# Patient Record
Sex: Female | Born: 1955 | Race: White | Hispanic: No | Marital: Married
Health system: Southern US, Community
[De-identification: ages and names within clinical notes are randomized; demographics above are authoritative.]

---

## 2016-01-05 ENCOUNTER — Other Ambulatory Visit (HOSPITAL_COMMUNITY): Payer: Self-pay

## 2016-01-05 ENCOUNTER — Inpatient Hospital Stay
Admission: AD | Admit: 2016-01-05 | Discharge: 2016-01-15 | Disposition: E | Payer: Self-pay | Source: Other Acute Inpatient Hospital | Attending: Nephrology | Admitting: Nephrology

## 2016-01-05 DIAGNOSIS — J9690 Respiratory failure, unspecified, unspecified whether with hypoxia or hypercapnia: Secondary | ICD-10-CM

## 2016-01-05 DIAGNOSIS — T8579XA Infection and inflammatory reaction due to other internal prosthetic devices, implants and grafts, initial encounter: Secondary | ICD-10-CM

## 2016-01-05 DIAGNOSIS — Z4659 Encounter for fitting and adjustment of other gastrointestinal appliance and device: Secondary | ICD-10-CM

## 2016-01-06 ENCOUNTER — Other Ambulatory Visit (HOSPITAL_COMMUNITY): Payer: Self-pay

## 2016-01-06 LAB — COMPREHENSIVE METABOLIC PANEL
ALBUMIN: 2.1 g/dL — AB (ref 3.5–5.0)
ALT: 14 U/L (ref 14–54)
AST: 26 U/L (ref 15–41)
Alkaline Phosphatase: 193 U/L — ABNORMAL HIGH (ref 38–126)
Anion gap: 11 (ref 5–15)
BUN: 36 mg/dL — AB (ref 6–20)
CHLORIDE: 98 mmol/L — AB (ref 101–111)
CO2: 28 mmol/L (ref 22–32)
Calcium: 8.9 mg/dL (ref 8.9–10.3)
Creatinine, Ser: 2.7 mg/dL — ABNORMAL HIGH (ref 0.44–1.00)
GFR calc Af Amer: 21 mL/min — ABNORMAL LOW (ref >60)
GFR, EST NON AFRICAN AMERICAN: 18 mL/min — AB (ref >60)
Glucose, Bld: 128 mg/dL — ABNORMAL HIGH (ref 65–99)
POTASSIUM: 3.4 mmol/L — AB (ref 3.5–5.1)
SODIUM: 137 mmol/L (ref 135–145)
Total Bilirubin: 0.6 mg/dL (ref 0.3–1.2)
Total Protein: 6.5 g/dL (ref 6.5–8.1)

## 2016-01-06 LAB — CBC
HEMATOCRIT: 27.3 % — AB (ref 36.0–46.0)
Hemoglobin: 7.5 g/dL — ABNORMAL LOW (ref 12.0–15.0)
MCH: 25.2 pg — AB (ref 26.0–34.0)
MCHC: 27.5 g/dL — ABNORMAL LOW (ref 30.0–36.0)
MCV: 91.6 fL (ref 78.0–100.0)
Platelets: 269 10*3/uL (ref 150–400)
RBC: 2.98 MIL/uL — ABNORMAL LOW (ref 3.87–5.11)
RDW: 23.2 % — AB (ref 11.5–15.5)
WBC: 17.1 10*3/uL — AB (ref 4.0–10.5)

## 2016-01-06 LAB — C DIFFICILE QUICK SCREEN W PCR REFLEX
C DIFFICILE (CDIFF) TOXIN: NEGATIVE
C DIFFICLE (CDIFF) ANTIGEN: NEGATIVE
C Diff interpretation: NEGATIVE

## 2016-01-06 LAB — PROTIME-INR
INR: 3.3 — ABNORMAL HIGH (ref 0.00–1.49)
Prothrombin Time: 32.9 seconds — ABNORMAL HIGH (ref 11.6–15.2)

## 2016-01-07 LAB — BASIC METABOLIC PANEL
Anion gap: 10 (ref 5–15)
BUN: 50 mg/dL — AB (ref 6–20)
CHLORIDE: 99 mmol/L — AB (ref 101–111)
CO2: 27 mmol/L (ref 22–32)
CREATININE: 3.13 mg/dL — AB (ref 0.44–1.00)
Calcium: 9 mg/dL (ref 8.9–10.3)
GFR calc Af Amer: 18 mL/min — ABNORMAL LOW (ref >60)
GFR calc non Af Amer: 15 mL/min — ABNORMAL LOW (ref >60)
GLUCOSE: 157 mg/dL — AB (ref 65–99)
POTASSIUM: 3.9 mmol/L (ref 3.5–5.1)
SODIUM: 136 mmol/L (ref 135–145)

## 2016-01-07 LAB — HEMOGLOBIN AND HEMATOCRIT, BLOOD
HCT: 28.6 % — ABNORMAL LOW (ref 36.0–46.0)
Hemoglobin: 7.9 g/dL — ABNORMAL LOW (ref 12.0–15.0)

## 2016-01-07 LAB — PROTIME-INR
INR: 3.31 — AB (ref 0.00–1.49)
PROTHROMBIN TIME: 33 s — AB (ref 11.6–15.2)

## 2016-01-07 LAB — AMMONIA: AMMONIA: 57 umol/L — AB (ref 9–35)

## 2016-01-07 NOTE — Consult Note (Signed)
Date: 01/07/2016                  Patient Name:  Ann OfferSharon S Matthis  MRN: [id number]  DOB: 11/06/1955  Age / Sex: 60 y.o., female         PCP: No primary care provider on file.                 Service Requesting Consult: Internal medicine [facility]                 Reason for Consult: Dialysis            History of Present Illness: Patient is a 60 y.o. female with medical problems of COPD, OSA, Ch UTI, A Fib, PE, DM-2, chronic hypotension, osteomyelitis,  who was admitted 12/19/2015  Patient is not able to provide medical details. 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 Patient deemed not candidate for surgery. Treated with iv antibiotics. Hospital course complicated by ARF that progressed to ESRD, needing chronic dialysis Initially started on CRRT on 5/22 Transitioned to IHD 5/22 Permcath placed in 12/14/15     Medications: Outpatient medications: No prescriptions prior to admission    Current medications: No current facility-administered medications for this encounter.      Allergies: Allergies not on file    Past Medical History: No past medical history on file.   Past Surgical History: No past surgical history on file.   Family History: No family history on file.   Social History: Social History   Social History  . Marital Status: Married    Spouse Name: N/A  . Number of Children: N/A  . Years of Education: N/A   Occupational History  . Not on file.   Social History Main Topics  . Smoking status: Not on file  . Smokeless tobacco: Not on file  . Alcohol Use: Not on file  . Drug Use: Not on file  . Sexual Activity: Not on file   Other Topics Concern  . Not on file   Social History Narrative  . No narrative on file     Review of Systems: not reliable Gen:  HEENT:  CV:  Resp:  GI: GU :  MS:  Derm:   Psych: Heme:  Neuro:   Endocrine  Vital Signs: T 97.4 P 112  R  26  BP 147/76   Weight trends: There were no vitals filed for this visit.  Physical Exam: General:  NAD, moaning, laying in bed, morbidly obese  HEENT NG tube in place  Neck:  supple  Lungs: Normal effort, [place] O2, limited exam, clear ant/lat  Heart::  irregular, a fib  Abdomen: Obese, dependent edema on pannus  Extremities:  +++ dependent edema, rt BKA  Neurologic: Alert, answers few questions  Skin: Dry skin with chronic changes  Access: Rt IJ permcath   Rectal tube present       Lab results: Basic Metabolic Panel:  Recent Labs Lab 01/06/16 0745 01/07/16 0544  NA 137 136  K 3.4* 3.9  CL 98* 99*  CO2 28 27  GLUCOSE 128* 157*  BUN 36* 50*  CREATININE 2.70* 3.13*  CALCIUM 8.9 9.0    Liver Function Tests:  Recent Labs Lab 01/06/16 0745  AST 26  ALT 14  ALKPHOS 193*  BILITOT 0.6  PROT 6.5  ALBUMIN 2.1*   No results for input(s): LIPASE, AMYLASE in the last 168 hours.  Recent Labs Lab 01/07/16 0544  AMMONIA 57*    CBC:  Recent Labs Lab 01/06/16 0745 01/07/16 0544  WBC 17.1*  --   HGB 7.5* 7.9*  HCT 27.3* 28.6*  MCV 91.6  --   PLT 269  --     Cardiac Enzymes: No results for input(s): CKTOTAL, TROPONINI in the last 168 hours.  BNP: Invalid input(s): POCBNP  CBG: No results for input(s): GLUCAP in the last 168 hours.  Microbiology: Recent Results (from the past 720 hour(s))  C difficile quick scan w PCR reflex     Status: None   Collection Time: 01/06/16 10:35 AM  Result Value Ref Range Status   C Diff antigen NEGATIVE NEGATIVE Final   C Diff toxin NEGATIVE NEGATIVE Final   C Diff interpretation Negative for toxigenic C. difficile  Final     Coagulation Studies:  Recent Labs  01/06/16 0745 01/07/16 0544  LABPROT 32.9* 33.0*  INR 3.30* 3.31*    Urinalysis: No results for input(s): COLORURINE, LABSPEC, PHURINE, GLUCOSEU, HGBUR, BILIRUBINUR, KETONESUR, PROTEINUR, UROBILINOGEN, NITRITE, LEUKOCYTESUR in the last 72  hours.  Invalid input(s): APPERANCEUR      Imaging: Dg Chest Port 1 View  01/06/2016  CLINICAL DATA:  Respiratory failure. EXAM: PORTABLE CHEST 1 VIEW COMPARISON:  No recent prior. FINDINGS: Dual-lumen right IJ catheter tip projected over the right upper atrium. NG tube tip below left hemidiaphragm. Cardiomegaly with pulmonary venous congestion and interstitial prominence with bilateral pleural effusions. Findings consistent congestive heart failure. Low lung volumes. No pneumothorax. IMPRESSION: 1. Right IJ dual-lumen catheter and NG tube in good anatomic position. 2. Cardiomegaly with pulmonary venous congestion and bilateral pulmonary interstitial prominence and pleural effusions consistent with congestive heart failure. Low lung volumes. Electronically Signed   By: Maisie Fushomas  Register   On: 01/06/2016 08:09   Dg Abd Portable 1v  09-12-15  CLINICAL DATA:  Evaluate nasogastric tube placement. (difficult for patient to lie completely flat due to decubitus ulcers.) EXAM: PORTABLE ABDOMEN - 1 VIEW COMPARISON:  None. FINDINGS: Nasogastric tube extends to the stomach. Center venous catheter to the cavoatrial junction. Gas distended loops of bowel in the mid abdomen. Lower abdomen excluded. Aortic and branch calcifications. IMPRESSION: 1. Nasogastric tube to the stomach. 2.  Aortic Atherosclerosis (ICD10-170.0) Electronically Signed   By: Corlis Leak  Hassell M.D.   On: 002-26-17 21:16      Assessment & Plan: Pt is a 60 y.o. yo female  with medical problems of COPD, OSA, Ch UTI, A Fib, PE, DM-2, chronic hypotension, osteomyelitis,  who was admitted 09-12-15    1. ESRD (ARF no recovery) on HD since 12/06/15 - will place on HD tomorrow then MWF schedule  2. Anasarca - Patient has massive 3rd spacing of fluid - will be difficult to remove with HD due to hypotension  3. Anemia, unspecified Check iron studies

## 2016-01-08 ENCOUNTER — Other Ambulatory Visit (HOSPITAL_COMMUNITY): Payer: Self-pay

## 2016-01-08 LAB — BASIC METABOLIC PANEL
ANION GAP: 12 (ref 5–15)
BUN: 68 mg/dL — AB (ref 6–20)
CALCIUM: 9 mg/dL (ref 8.9–10.3)
CO2: 25 mmol/L (ref 22–32)
Chloride: 97 mmol/L — ABNORMAL LOW (ref 101–111)
Creatinine, Ser: 3.51 mg/dL — ABNORMAL HIGH (ref 0.44–1.00)
GFR calc Af Amer: 15 mL/min — ABNORMAL LOW (ref >60)
GFR, EST NON AFRICAN AMERICAN: 13 mL/min — AB (ref >60)
GLUCOSE: 174 mg/dL — AB (ref 65–99)
Potassium: 3.7 mmol/L (ref 3.5–5.1)
Sodium: 134 mmol/L — ABNORMAL LOW (ref 135–145)

## 2016-01-08 LAB — RENAL FUNCTION PANEL
ALBUMIN: 2.2 g/dL — AB (ref 3.5–5.0)
ANION GAP: 11 (ref 5–15)
BUN: 68 mg/dL — AB (ref 6–20)
CO2: 26 mmol/L (ref 22–32)
Calcium: 9.2 mg/dL (ref 8.9–10.3)
Chloride: 99 mmol/L — ABNORMAL LOW (ref 101–111)
Creatinine, Ser: 3.43 mg/dL — ABNORMAL HIGH (ref 0.44–1.00)
GFR calc Af Amer: 16 mL/min — ABNORMAL LOW (ref >60)
GFR calc non Af Amer: 14 mL/min — ABNORMAL LOW (ref >60)
Glucose, Bld: 173 mg/dL — ABNORMAL HIGH (ref 65–99)
POTASSIUM: 3.7 mmol/L (ref 3.5–5.1)
Phosphorus: 4.3 mg/dL (ref 2.5–4.6)
SODIUM: 136 mmol/L (ref 135–145)

## 2016-01-08 LAB — IRON AND TIBC
Iron: 14 ug/dL — ABNORMAL LOW (ref 28–170)
SATURATION RATIOS: 5 % — AB (ref 10.4–31.8)
TIBC: 294 ug/dL (ref 250–450)
UIBC: 280 ug/dL

## 2016-01-08 LAB — CBC
HEMATOCRIT: 27.1 % — AB (ref 36.0–46.0)
HEMOGLOBIN: 7.6 g/dL — AB (ref 12.0–15.0)
MCH: 25.3 pg — AB (ref 26.0–34.0)
MCHC: 28 g/dL — AB (ref 30.0–36.0)
MCV: 90.3 fL (ref 78.0–100.0)
Platelets: 296 10*3/uL (ref 150–400)
RBC: 3 MIL/uL — ABNORMAL LOW (ref 3.87–5.11)
RDW: 22.5 % — AB (ref 11.5–15.5)
WBC: 17.8 10*3/uL — ABNORMAL HIGH (ref 4.0–10.5)

## 2016-01-08 LAB — PROTIME-INR
INR: 2.96 — ABNORMAL HIGH (ref 0.00–1.49)
INR: 2.3 — ABNORMAL HIGH (ref 0.00–1.49)
PROTHROMBIN TIME: 30.3 s — AB (ref 11.6–15.2)
Prothrombin Time: 25.1 seconds — ABNORMAL HIGH (ref 11.6–15.2)

## 2016-01-08 LAB — TRANSFERRIN: Transferrin: 210 mg/dL (ref 192–382)

## 2016-01-08 LAB — FERRITIN: Ferritin: 104 ng/mL (ref 11–307)

## 2016-01-09 ENCOUNTER — Other Ambulatory Visit (HOSPITAL_COMMUNITY): Payer: Self-pay

## 2016-01-09 LAB — CBC
HEMATOCRIT: 26.7 % — AB (ref 36.0–46.0)
HEMOGLOBIN: 7.4 g/dL — AB (ref 12.0–15.0)
MCH: 25 pg — ABNORMAL LOW (ref 26.0–34.0)
MCHC: 27.7 g/dL — AB (ref 30.0–36.0)
MCV: 90.2 fL (ref 78.0–100.0)
Platelets: 255 10*3/uL (ref 150–400)
RBC: 2.96 MIL/uL — AB (ref 3.87–5.11)
RDW: 22.3 % — ABNORMAL HIGH (ref 11.5–15.5)
WBC: 18.3 10*3/uL — AB (ref 4.0–10.5)

## 2016-01-09 LAB — BASIC METABOLIC PANEL
ANION GAP: 11 (ref 5–15)
BUN: 45 mg/dL — ABNORMAL HIGH (ref 6–20)
CHLORIDE: 98 mmol/L — AB (ref 101–111)
CO2: 26 mmol/L (ref 22–32)
CREATININE: 2.63 mg/dL — AB (ref 0.44–1.00)
Calcium: 8.8 mg/dL — ABNORMAL LOW (ref 8.9–10.3)
GFR calc non Af Amer: 19 mL/min — ABNORMAL LOW (ref >60)
GFR, EST AFRICAN AMERICAN: 22 mL/min — AB (ref >60)
Glucose, Bld: 103 mg/dL — ABNORMAL HIGH (ref 65–99)
POTASSIUM: 3.7 mmol/L (ref 3.5–5.1)
SODIUM: 135 mmol/L (ref 135–145)

## 2016-01-09 LAB — PROTIME-INR
INR: 2.37 — AB (ref 0.00–1.49)
PROTHROMBIN TIME: 25.6 s — AB (ref 11.6–15.2)

## 2016-01-09 LAB — PARATHYROID HORMONE, INTACT (NO CA): PTH: 71 pg/mL — ABNORMAL HIGH (ref 15–65)

## 2016-01-09 LAB — HEPATITIS B CORE ANTIBODY, TOTAL: HEP B C TOTAL AB: NEGATIVE

## 2016-01-09 LAB — HEPATITIS B SURFACE ANTIGEN: Hepatitis B Surface Ag: NEGATIVE

## 2016-01-09 LAB — HEPATITIS B SURFACE ANTIBODY,QUALITATIVE: HEP B S AB: NONREACTIVE

## 2016-01-10 LAB — RENAL FUNCTION PANEL
ANION GAP: 10 (ref 5–15)
Albumin: 2.1 g/dL — ABNORMAL LOW (ref 3.5–5.0)
BUN: 32 mg/dL — ABNORMAL HIGH (ref 6–20)
CALCIUM: 8.5 mg/dL — AB (ref 8.9–10.3)
CHLORIDE: 98 mmol/L — AB (ref 101–111)
CO2: 27 mmol/L (ref 22–32)
Creatinine, Ser: 2.12 mg/dL — ABNORMAL HIGH (ref 0.44–1.00)
GFR calc non Af Amer: 24 mL/min — ABNORMAL LOW (ref >60)
GFR, EST AFRICAN AMERICAN: 28 mL/min — AB (ref >60)
Glucose, Bld: 97 mg/dL (ref 65–99)
POTASSIUM: 3.5 mmol/L (ref 3.5–5.1)
Phosphorus: 2.6 mg/dL (ref 2.5–4.6)
SODIUM: 135 mmol/L (ref 135–145)

## 2016-01-10 LAB — CBC
HEMATOCRIT: 27.2 % — AB (ref 36.0–46.0)
HEMOGLOBIN: 7.7 g/dL — AB (ref 12.0–15.0)
MCH: 25.9 pg — AB (ref 26.0–34.0)
MCHC: 28.3 g/dL — ABNORMAL LOW (ref 30.0–36.0)
MCV: 91.6 fL (ref 78.0–100.0)
Platelets: 309 10*3/uL (ref 150–400)
RBC: 2.97 MIL/uL — AB (ref 3.87–5.11)
RDW: 22.3 % — ABNORMAL HIGH (ref 11.5–15.5)
WBC: 15.9 10*3/uL — ABNORMAL HIGH (ref 4.0–10.5)

## 2016-01-10 LAB — PROTIME-INR
INR: 2.78 — ABNORMAL HIGH (ref 0.00–1.49)
Prothrombin Time: 28.9 seconds — ABNORMAL HIGH (ref 11.6–15.2)

## 2016-01-10 LAB — HEMOGLOBIN A1C
Hgb A1c MFr Bld: 5.5 % (ref 4.8–5.6)
Mean Plasma Glucose: 111 mg/dL

## 2016-01-10 NOTE — Progress Notes (Signed)
[facility]  ROUNDING NOTE   Subjective:  Patient completed hemodialysis today via PermCath. Seems to be in pain at the moment. Per dialysis nurse tolerated dialysis well.    Objective:  Vital signs in last 24 hours:   temp 99.3 pulse 81 respirations 17 blood pressure 116/60  Physical Exam: General: Obese female  Head: Normocephalic, atraumatic. Moist oral mucosal membranes  Eyes: Anicteric  Neck: Supple, trachea midline  Lungs:  Clear to auscultation. Limited exam  Heart: S1S2 no rubs  Abdomen:  Soft, obese, non tender   Extremities: R BKA, anasarca noted  Neurologic: Awake, not following commands consistently  Skin: Lichenification of skin LLE  Access: Rt IJ PC    Basic Metabolic Panel:  Recent Labs Lab 01/06/16 0745 01/07/16 0544 01/08/16 0610 01/09/16 0603 01/10/16 0945  NA 137 136 136  134* 135 135  K 3.4* 3.9 3.7  3.7 3.7 3.5  CL 98* 99* 99*  97* 98* 98*  CO2 28 27 26  25 26 27   GLUCOSE 128* 157* 173*  174* 103* 97  BUN 36* 50* 68*  68* 45* 32*  CREATININE 2.70* 3.13* 3.43*  3.51* 2.63* 2.12*  CALCIUM 8.9 9.0 9.2  9.0 8.8* 8.5*  PHOS  --   --  4.3  --  2.6    Liver Function Tests:  Recent Labs Lab 01/06/16 0745 01/08/16 0610 01/10/16 0945  AST 26  --   --   ALT 14  --   --   ALKPHOS 193*  --   --   BILITOT 0.6  --   --   PROT 6.5  --   --   ALBUMIN 2.1* 2.2* 2.1*   No results for input(s): LIPASE, AMYLASE in the last 168 hours.  Recent Labs Lab 01/07/16 0544  AMMONIA 57*    CBC:  Recent Labs Lab 01/06/16 0745 01/07/16 0544 01/08/16 0610 01/09/16 0603 01/10/16 0945  WBC 17.1*  --  17.8* 18.3* 15.9*  HGB 7.5* 7.9* 7.6* 7.4* 7.7*  HCT 27.3* 28.6* 27.1* 26.7* 27.2*  MCV 91.6  --  90.3 90.2 91.6  PLT 269  --  296 255 309    Cardiac Enzymes: No results for input(s): CKTOTAL, CKMB, CKMBINDEX, TROPONINI in the last 168 hours.  BNP: Invalid input(s): POCBNP  CBG: No results for input(s): GLUCAP in the last  168 hours.  Microbiology: Results for orders placed or performed during the hospital encounter of 12/22/2015  C difficile quick scan w PCR reflex     Status: None   Collection Time: 01/06/16 10:35 AM  Result Value Ref Range Status   C Diff antigen NEGATIVE NEGATIVE Final   C Diff toxin NEGATIVE NEGATIVE Final   C Diff interpretation Negative for toxigenic C. difficile  Final    Coagulation Studies:  Recent Labs  01/08/16 0610 01/08/16 1342 01/09/16 0603 01/10/16 0945  LABPROT 30.3* 25.1* 25.6* 28.9*  INR 2.96* 2.30* 2.37* 2.78*    Urinalysis: No results for input(s): COLORURINE, LABSPEC, PHURINE, GLUCOSEU, HGBUR, BILIRUBINUR, KETONESUR, PROTEINUR, UROBILINOGEN, NITRITE, LEUKOCYTESUR in the last 72 hours.  Invalid input(s): APPERANCEUR    Imaging: Dg Abd Portable 1v  01/09/2016  CLINICAL DATA:  Nasogastric tube placement.  Initial encounter. EXAM: PORTABLE ABDOMEN - 1 VIEW COMPARISON:  Abdominal radiograph performed 01/08/2016 FINDINGS: The patient's enteric tube is noted ending overlying the antrum of the stomach. This is difficult to fully assess given patient rotation. The visualized bowel gas pattern is grossly unremarkable, though not fully assessed.  No acute osseous abnormalities are identified. IMPRESSION: Enteric tube noted ending overlying the antrum of the stomach. This is difficult to fully assess given patient rotation. Electronically Signed   By: Roanna RaiderJeffery  Chang M.D.   On: 01/09/2016 02:12   Dg Abd Portable 1v  01/08/2016  CLINICAL DATA:  Encounter for NG tube placement EXAM: PORTABLE ABDOMEN - 1 VIEW COMPARISON:  None. FINDINGS: NGT with tip at the GE junction. Tube appears retracted from comparison exam bladder several cm. IMPRESSION: NG tube with tip at the GE junction. Electronically Signed   By: Genevive BiStewart  Edmunds M.D.   On: 01/08/2016 21:57     Medications:       Assessment/ Plan:  60 y.o. female with medical problems of COPD, OSA, Ch UTI, A Fib, PE, DM-2,  chronic hypotension, osteomyelitis, who was admitted 03-Jun-2016    1. ESRD (ARF no recovery) on HD since 12/06/15 - patient completed hemodialysis today and tolerated well. We will plan for dialysis again on Wednesday. Continue midodrine for blood pressure support.  2. Anasarca/Generalized edema - continue gentle ultrafiltration with dialysis as tolerated.  3. Anemia of chronic kidney disease. Hemoglobin 7.7. Start on Aranesp per protocol.  Iron saturation also quite low. Start on venofer100 mg IV weekly.     LOS:  Diania Co 6/26/20174:03 PM

## 2016-01-11 ENCOUNTER — Other Ambulatory Visit (HOSPITAL_COMMUNITY): Payer: Self-pay

## 2016-01-11 LAB — BLOOD GAS, ARTERIAL
ACID-BASE DEFICIT: 17 mmol/L — AB (ref 0.0–2.0)
Bicarbonate: 12.5 mEq/L — ABNORMAL LOW (ref 20.0–24.0)
FIO2: 1
O2 SAT: 96.6 %
PCO2 ART: 56 mmHg — AB (ref 35.0–45.0)
PEEP/CPAP: 5 cmH2O
PH ART: 6.977 — AB (ref 7.350–7.450)
Patient temperature: 98.6
RATE: 24 resp/min
TCO2: 14.2 mmol/L (ref 0–100)
VT: 500 mL
pO2, Arterial: 135 mmHg — ABNORMAL HIGH (ref 80.0–100.0)

## 2016-01-11 LAB — PROTIME-INR
INR: 2.23 — AB (ref 0.00–1.49)
PROTHROMBIN TIME: 24.5 s — AB (ref 11.6–15.2)

## 2016-01-14 MED FILL — Medication: Qty: 1 | Status: AC

## 2016-01-15 DEATH — deceased

## 2018-03-28 IMAGING — CR DG ABD PORTABLE 1V
1 series · 1 of 1 positions shown · non-contrast
Comparison: Abdominal radiograph performed 01/08/2016

CLINICAL DATA: Nasogastric tube placement.  Initial encounter.

EXAM:
PORTABLE ABDOMEN - 1 VIEW

[AP]
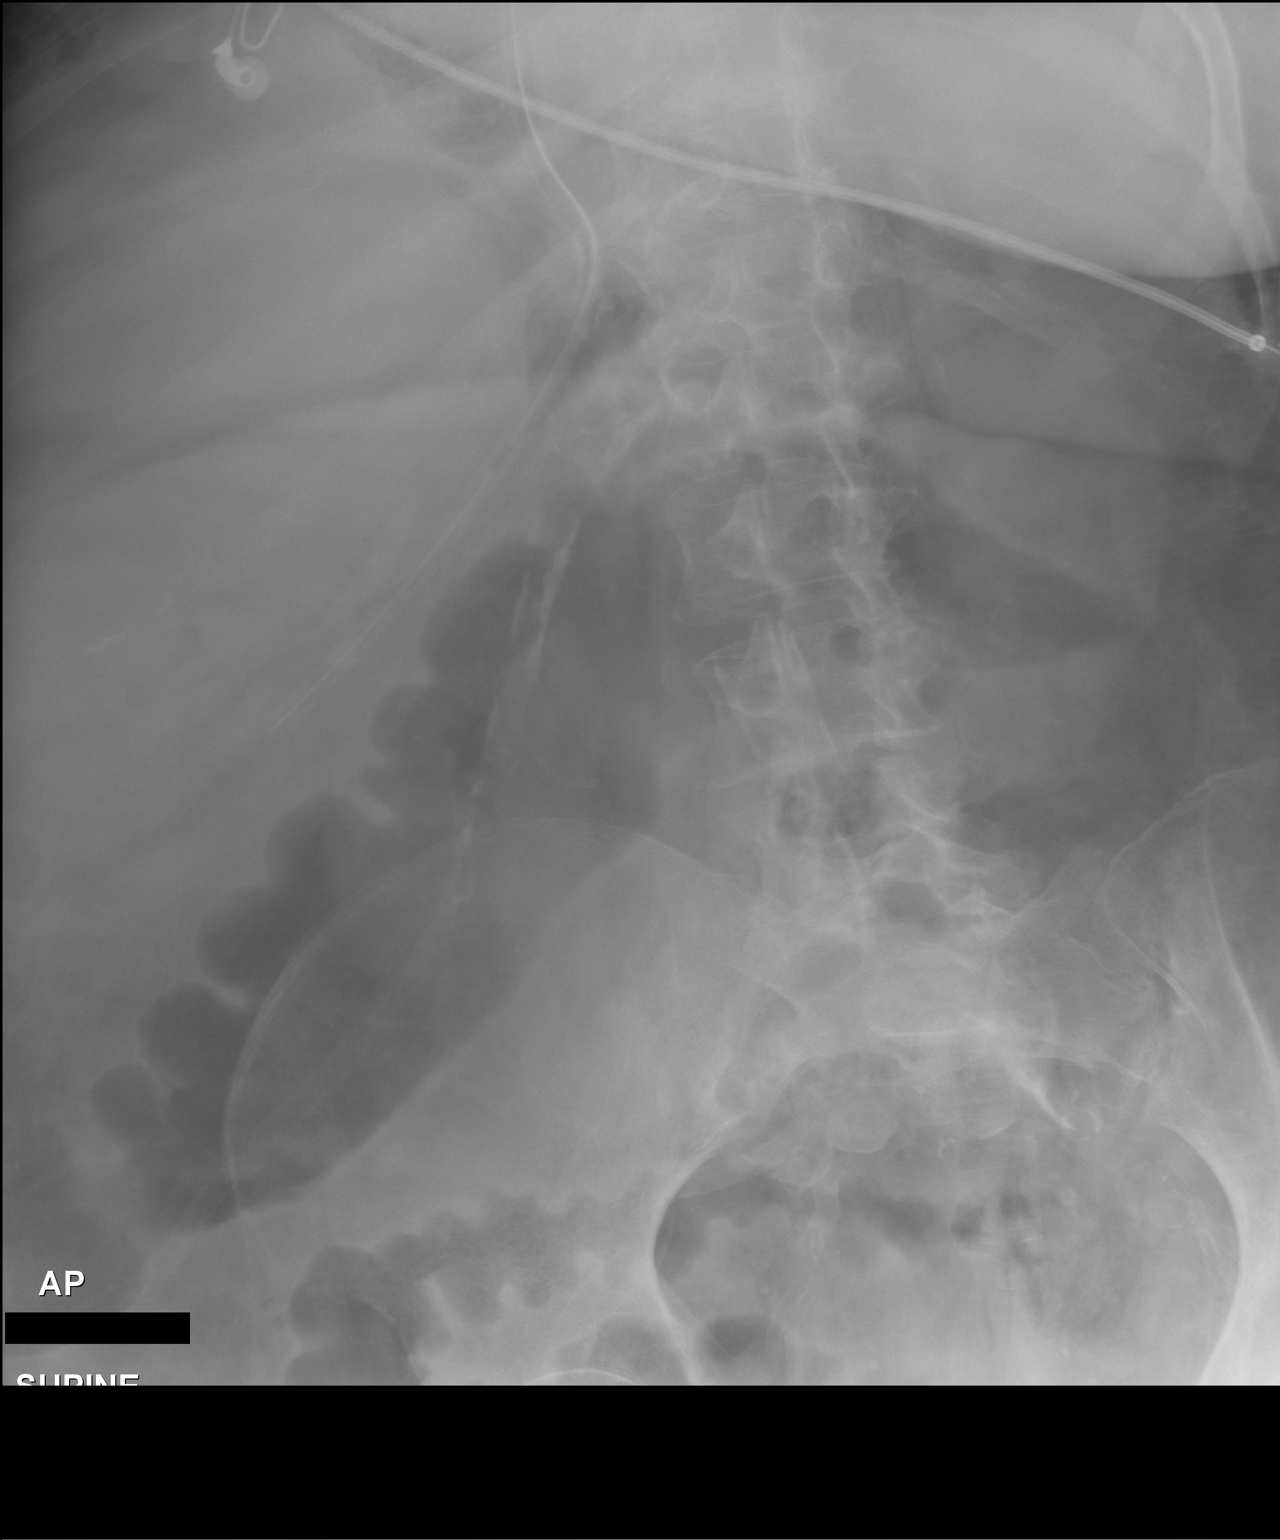

[1 of 1 positions shown; findings below may reference images not displayed]

FINDINGS: The patient's enteric tube is noted ending overlying the antrum of
the stomach. This is difficult to fully assess given patient
rotation.

The visualized bowel gas pattern is grossly unremarkable, though not
fully assessed. No acute osseous abnormalities are identified.
IMPRESSION: Enteric tube noted ending overlying the antrum of the stomach. This
is difficult to fully assess given patient rotation.
